# Patient Record
Sex: Male | Born: 1990 | Race: White | Hispanic: No | Marital: Single | Smoking: Never smoker
Health system: Southern US, Community
[De-identification: ages and names within clinical notes are randomized; demographics above are authoritative.]

## PROBLEM LIST (undated history)

## (undated) DIAGNOSIS — Z9889 Other specified postprocedural states: Secondary | ICD-10-CM

## (undated) DIAGNOSIS — R112 Nausea with vomiting, unspecified: Secondary | ICD-10-CM

---

## 2015-08-10 ENCOUNTER — Emergency Department (HOSPITAL_BASED_OUTPATIENT_CLINIC_OR_DEPARTMENT_OTHER)
Admission: EM | Admit: 2015-08-10 | Discharge: 2015-08-11 | Disposition: A | Discharge status: Home / Self Care | Payer: Worker's Compensation | Attending: Emergency Medicine | Admitting: Emergency Medicine

## 2015-08-10 ENCOUNTER — Emergency Department (HOSPITAL_BASED_OUTPATIENT_CLINIC_OR_DEPARTMENT_OTHER): Payer: Worker's Compensation

## 2015-08-10 ENCOUNTER — Encounter (HOSPITAL_BASED_OUTPATIENT_CLINIC_OR_DEPARTMENT_OTHER): Payer: Self-pay | Admitting: Emergency Medicine

## 2015-08-10 DIAGNOSIS — Y990 Civilian activity done for income or pay: Secondary | ICD-10-CM | POA: Diagnosis not present

## 2015-08-10 DIAGNOSIS — Y9289 Other specified places as the place of occurrence of the external cause: Secondary | ICD-10-CM | POA: Insufficient documentation

## 2015-08-10 DIAGNOSIS — S60222A Contusion of left hand, initial encounter: Secondary | ICD-10-CM | POA: Insufficient documentation

## 2015-08-10 DIAGNOSIS — S6992XA Unspecified injury of left wrist, hand and finger(s), initial encounter: Secondary | ICD-10-CM | POA: Diagnosis present

## 2015-08-10 DIAGNOSIS — W228XXA Striking against or struck by other objects, initial encounter: Secondary | ICD-10-CM | POA: Insufficient documentation

## 2015-08-10 DIAGNOSIS — Y9389 Activity, other specified: Secondary | ICD-10-CM | POA: Diagnosis not present

## 2015-08-10 MED ORDER — HYDROCODONE-ACETAMINOPHEN 5-325 MG PO TABS: 1.0000 | ORAL_TABLET | Freq: Four times a day (QID) | ORAL | Status: DC | PRN

## 2015-08-10 MED ORDER — HYDROCODONE-ACETAMINOPHEN 5-325 MG PO TABS
1.0000 | ORAL_TABLET | Freq: Once | ORAL | Status: AC
  Administered 2015-08-10: 1 via ORAL
  Filled 2015-08-10: qty 1

## 2015-08-10 NOTE — ED Provider Notes (Signed)
CSN: [id number]   Arrival date & time 08/10/15 2250  History  By signing my name below, I, Bethel BornBritney McCollum, attest that this documentation has been prepared under the direction and in the presence of Paula LibraJohn Chandria Rookstool, MD. Electronically Signed: Bethel BornBritney McCollum, ED Scribe. 08/10/2015. 11:40 PM.  Chief Complaint  Patient presents with  . Hand Injury    HPI The history is provided by the patient. No language interpreter was used.   Ernest GarlandMichael Tomassetti is a 24 y.o. male who presents to the Emergency Department complaining of a left hand injury with onset this evening at work. He was working with a Database administratorrivet gun; when the trigger was compressed the gun fell impacting the tool on the hand.  Associated symptoms include pain at the left hand that is worst between the thumb and ring finger. He describes the pain as "really sore", worse with palpation or movement. There is associated swelling and ecchymosis but no laceration or abrasion. No other injury.   History reviewed. No pertinent past medical history.  History reviewed. No pertinent past surgical history.  History reviewed. No pertinent family history.  Social History  Substance Use Topics  . Smoking status: Never Smoker   . Smokeless tobacco: None  . Alcohol Use: Yes     Comment: occ     Review of Systems 10 Systems reviewed and all are negative for acute change except as noted in the HPI. Home Medications   Prior to Admission medications   Not on File    Allergies  Review of patient's allergies indicates no known allergies.  Triage Vitals: BP 128/87 mmHg  Pulse 68  Temp(Src) 98.7 F (37.1 C) (Oral)  Resp 18  Ht 5\' 10"  (1.778 m)  Wt 130 lb (58.968 kg)  BMI 18.65 kg/m2  SpO2 100%  Physical Exam  Nursing note and vitals reviewed. General: Well-developed, well-nourished male in no acute distress; appearance consistent with age of record HENT: normocephalic; atraumatic Eyes: pupils equal, round and reactive to light; extraocular  muscles intact Neck: supple Heart: regular rate and rhythm Lungs: clear to auscultation bilaterally Abdomen: soft; nondistended Extremities: Swelling and ecchymosis of left thenar web with no bony point tenderness except at the second MCP joint; fingers of left hand NVI with intact tendon function; no deformity; decreased range of motion of left thumb and second finger; no wrist or anatomical snuff box tenderness; pulses normal Neurologic: Awake, alert and oriented; motor function intact in all extremities and symmetric; no facial droop Skin: Warm and dry Psychiatric: Normal mood and affect  ED Course  Procedures   DIAGNOSTIC STUDIES: Oxygen Saturation is 100% on RA, normal by my interpretation.    COORDINATION OF CARE: 11:29 PM Discussed treatment plan which includes left hand XR and pain management with pt at bedside and pt agreed to plan.   MDM  Nursing notes and vitals signs, including pulse oximetry, reviewed.  Summary of this visit's results, reviewed by myself:  Imaging Studies: Dg Hand Complete Left  08/10/2015  CLINICAL DATA:  24 year old male equipment fell on left hand at work 2 hr ago with pain from the 1st to 2nd ray. Initial encounter. EXAM: LEFT HAND - COMPLETE 3+ VIEW COMPARISON:  None. FINDINGS: Bone mineralization is within normal limits. Distal radius and ulna intact. Carpal bone alignment within normal limits. No metacarpal fracture identified. No phalanx fracture identified. Left thumb and index finger osseous structures appear within normal limits. No radiopaque foreign body identified. No subcutaneous gas. IMPRESSION: No acute fracture or dislocation  identified about the left hand. Electronically Signed   By: Odessa Fleming M.D.   On: 08/10/2015 23:31   I personally performed the services described in this documentation, which was scribed in my presence. The recorded information has been reviewed and is accurate.    Paula Libra, MD 08/10/15 [phone]

## 2015-08-10 NOTE — ED Notes (Addendum)
Patient was hit in the hand with a pneumatic tool. Patient states that the tool was at full pressure when it hit his hand, and he has swelling and possible deformity noted.

## 2015-08-10 NOTE — Discharge Instructions (Signed)
Hand Contusion  A hand contusion is a deep bruise on your hand area. Contusions are the result of an injury that caused bleeding under the skin. The contusion may turn blue, purple, or yellow. Minor injuries will give you a painless contusion, but more severe contusions may stay painful and swollen for a few weeks.  CAUSES   A contusion is usually caused by a blow, trauma, or direct force to an area of the body.  SYMPTOMS    Swelling and redness of the injured area.   Discoloration of the injured area.   Tenderness and soreness of the injured area.   Pain.  DIAGNOSIS   The diagnosis can be made by taking a history and performing a physical exam. An X-ray, CT scan, or MRI may be needed to determine if there were any associated injuries, such as broken bones (fractures).  TREATMENT   Often, the best treatment for a hand contusion is resting, elevating, icing, and applying cold compresses to the injured area. Over-the-counter medicines may also be recommended for pain control.  HOME CARE INSTRUCTIONS    Put ice on the injured area.    Put ice in a plastic bag.    Place a towel between your skin and the bag.    Leave the ice on for 15-20 minutes, 03-04 times a day.   Only take over-the-counter or prescription medicines as directed by your caregiver. Your caregiver may recommend avoiding anti-inflammatory medicines (aspirin, ibuprofen, and naproxen) for 48 hours because these medicines may increase bruising.   If told, use an elastic wrap as directed. This can help reduce swelling. You may remove the wrap for sleeping, showering, and bathing. If your fingers become numb, cold, or blue, take the wrap off and reapply it more loosely.   Elevate your hand with pillows to reduce swelling.   Avoid overusing your hand if it is painful.  SEEK IMMEDIATE MEDICAL CARE IF:    You have increased redness, swelling, or pain in your hand.   Your swelling or pain is not relieved with medicines.   You have loss of feeling in  your hand or are unable to move your fingers.   Your hand turns cold or blue.   You have pain when you move your fingers.   Your hand becomes warm to the touch.   Your contusion does not improve in 2 days.  MAKE SURE YOU:    Understand these instructions.   Will watch your condition.   Will get help right away if you are not doing well or get worse.     This information is not intended to replace advice given to you by your health care provider. Make sure you discuss any questions you have with your health care provider.     Document Released: 04/06/2002 Document Revised: 07/09/2012 Document Reviewed: 04/07/2012  Elsevier Interactive Patient Education 2016 [employer]

## 2016-02-02 ENCOUNTER — Emergency Department (HOSPITAL_COMMUNITY): Payer: Worker's Compensation | Admitting: Certified Registered Nurse Anesthetist

## 2016-02-02 ENCOUNTER — Observation Stay (HOSPITAL_COMMUNITY)
Admission: EM | Admit: 2016-02-02 | Discharge: 2016-02-03 | Disposition: A | Discharge status: Home / Self Care | Payer: Worker's Compensation | Attending: Orthopedic Surgery | Admitting: Orthopedic Surgery

## 2016-02-02 ENCOUNTER — Emergency Department (HOSPITAL_COMMUNITY): Payer: Worker's Compensation

## 2016-02-02 ENCOUNTER — Encounter (HOSPITAL_COMMUNITY)
Admission: EM | Disposition: A | Discharge status: Home / Self Care | Payer: Self-pay | Source: Home / Self Care | Attending: Emergency Medicine

## 2016-02-02 ENCOUNTER — Encounter (HOSPITAL_COMMUNITY): Payer: Self-pay

## 2016-02-02 DIAGNOSIS — Y9269 Other specified industrial and construction area as the place of occurrence of the external cause: Secondary | ICD-10-CM | POA: Diagnosis not present

## 2016-02-02 DIAGNOSIS — W38XXXA Explosion and rupture of other specified pressurized devices, initial encounter: Secondary | ICD-10-CM | POA: Insufficient documentation

## 2016-02-02 DIAGNOSIS — S6981XA Other specified injuries of right wrist, hand and finger(s), initial encounter: Secondary | ICD-10-CM | POA: Diagnosis present

## 2016-02-02 DIAGNOSIS — W298XXA Contact with other powered powered hand tools and household machinery, initial encounter: Secondary | ICD-10-CM

## 2016-02-02 DIAGNOSIS — Z23 Encounter for immunization: Secondary | ICD-10-CM | POA: Diagnosis not present

## 2016-02-02 DIAGNOSIS — S61421A Laceration with foreign body of right hand, initial encounter: Principal | ICD-10-CM | POA: Insufficient documentation

## 2016-02-02 DIAGNOSIS — Y990 Civilian activity done for income or pay: Secondary | ICD-10-CM | POA: Insufficient documentation

## 2016-02-02 HISTORY — PX: I & D EXTREMITY: SHX5045

## 2016-02-02 SURGERY — IRRIGATION AND DEBRIDEMENT EXTREMITY
Anesthesia: General | Laterality: Right

## 2016-02-02 MED ORDER — HYDROMORPHONE HCL 1 MG/ML IJ SOLN
1.0000 mg | Freq: Once | INTRAMUSCULAR | Status: AC
  Administered 2016-02-02: 1 mg via INTRAVENOUS
  Filled 2016-02-02: qty 1

## 2016-02-02 MED ORDER — LACTATED RINGERS IV SOLN
INTRAVENOUS | Status: DC
  Administered 2016-02-02 (×2): via INTRAVENOUS

## 2016-02-02 MED ORDER — FENTANYL CITRATE (PF) 250 MCG/5ML IJ SOLN
INTRAMUSCULAR | Status: AC
  Filled 2016-02-02: qty 5

## 2016-02-02 MED ORDER — CEFAZOLIN SODIUM 1-5 GM-% IV SOLN
INTRAVENOUS | Status: DC | PRN
  Administered 2016-02-02: 1 g via INTRAVENOUS

## 2016-02-02 MED ORDER — OXYCODONE HCL 5 MG PO TABS: 5.0000 mg | ORAL_TABLET | Freq: Once | ORAL | Status: DC | PRN

## 2016-02-02 MED ORDER — OXYCODONE HCL 5 MG/5ML PO SOLN: 5.0000 mg | Freq: Once | ORAL | Status: DC | PRN

## 2016-02-02 MED ORDER — ACETAMINOPHEN 325 MG PO TABS: 325.0000 mg | ORAL_TABLET | ORAL | Status: DC | PRN

## 2016-02-02 MED ORDER — ONDANSETRON HCL 4 MG/2ML IJ SOLN
4.0000 mg | Freq: Four times a day (QID) | INTRAMUSCULAR | Status: DC | PRN
  Administered 2016-02-02: 4 mg via INTRAVENOUS
  Filled 2016-02-02: qty 2

## 2016-02-02 MED ORDER — ONDANSETRON HCL 4 MG PO TABS: 4.0000 mg | ORAL_TABLET | Freq: Four times a day (QID) | ORAL | Status: DC | PRN

## 2016-02-02 MED ORDER — IBUPROFEN 800 MG PO TABS
800.0000 mg | ORAL_TABLET | Freq: Three times a day (TID) | ORAL | Status: DC
  Administered 2016-02-02 – 2016-02-03 (×3): 800 mg via ORAL
  Filled 2016-02-02: qty 4
  Filled 2016-02-02 (×4): qty 1
  Filled 2016-02-02 (×2): qty 4
  Filled 2016-02-02: qty 1

## 2016-02-02 MED ORDER — OXYCODONE-ACETAMINOPHEN 5-325 MG PO TABS
1.0000 | ORAL_TABLET | ORAL | Status: DC | PRN
  Administered 2016-02-02 – 2016-02-03 (×3): 2 via ORAL
  Filled 2016-02-02 (×2): qty 2

## 2016-02-02 MED ORDER — PROPOFOL 10 MG/ML IV BOLUS
INTRAVENOUS | Status: DC | PRN
  Administered 2016-02-02: 200 mg via INTRAVENOUS

## 2016-02-02 MED ORDER — CEFAZOLIN SODIUM 1-5 GM-% IV SOLN
1.0000 g | Freq: Three times a day (TID) | INTRAVENOUS | Status: AC
  Administered 2016-02-02 – 2016-02-03 (×2): 1 g via INTRAVENOUS
  Filled 2016-02-02 (×3): qty 50

## 2016-02-02 MED ORDER — OXYCODONE-ACETAMINOPHEN 5-325 MG PO TABS
ORAL_TABLET | ORAL | Status: AC
  Filled 2016-02-02: qty 2

## 2016-02-02 MED ORDER — ACETAMINOPHEN 160 MG/5ML PO SOLN
325.0000 mg | ORAL | Status: DC | PRN
  Filled 2016-02-02: qty 20.3

## 2016-02-02 MED ORDER — OXYCODONE-ACETAMINOPHEN 5-325 MG PO TABS: 1.0000 | ORAL_TABLET | ORAL | Status: DC | PRN

## 2016-02-02 MED ORDER — HYDROMORPHONE HCL 1 MG/ML IJ SOLN
INTRAMUSCULAR | Status: AC
  Filled 2016-02-02: qty 2

## 2016-02-02 MED ORDER — LIDOCAINE HCL (CARDIAC) 20 MG/ML IV SOLN
INTRAVENOUS | Status: DC | PRN
  Administered 2016-02-02: 60 mg via INTRATRACHEAL

## 2016-02-02 MED ORDER — CEFAZOLIN SODIUM 1-5 GM-% IV SOLN
1.0000 g | Freq: Once | INTRAVENOUS | Status: AC
  Administered 2016-02-02: 1 g via INTRAVENOUS
  Filled 2016-02-02: qty 50

## 2016-02-02 MED ORDER — SODIUM CHLORIDE 0.9 % IR SOLN
Status: DC | PRN
  Administered 2016-02-02: 1000 mL
  Administered 2016-02-02: 3000 mL

## 2016-02-02 MED ORDER — HYDROMORPHONE HCL 2 MG PO TABS: 2.0000 mg | ORAL_TABLET | ORAL | Status: DC | PRN

## 2016-02-02 MED ORDER — ONDANSETRON HCL 4 MG/2ML IJ SOLN
INTRAMUSCULAR | Status: DC | PRN
  Administered 2016-02-02: 4 mg via INTRAVENOUS

## 2016-02-02 MED ORDER — HYDROMORPHONE HCL 1 MG/ML IJ SOLN
0.2500 mg | INTRAMUSCULAR | Status: DC | PRN
  Administered 2016-02-02 (×4): 0.5 mg via INTRAVENOUS

## 2016-02-02 MED ORDER — TETANUS-DIPHTH-ACELL PERTUSSIS 5-2.5-18.5 LF-MCG/0.5 IM SUSP
0.5000 mL | Freq: Once | INTRAMUSCULAR | Status: AC
  Administered 2016-02-02: 0.5 mL via INTRAMUSCULAR
  Filled 2016-02-02: qty 0.5

## 2016-02-02 MED ORDER — MIDAZOLAM HCL 2 MG/2ML IJ SOLN
INTRAMUSCULAR | Status: AC
  Filled 2016-02-02: qty 2

## 2016-02-02 MED ORDER — BUPIVACAINE-EPINEPHRINE (PF) 0.5% -1:200000 IJ SOLN
INTRAMUSCULAR | Status: DC | PRN
  Administered 2016-02-02: 40 mL

## 2016-02-02 MED ORDER — IBUPROFEN 800 MG PO TABS: 800.0000 mg | ORAL_TABLET | Freq: Three times a day (TID) | ORAL | Status: AC

## 2016-02-02 MED ORDER — PROPOFOL 10 MG/ML IV BOLUS
INTRAVENOUS | Status: AC
  Filled 2016-02-02: qty 20

## 2016-02-02 MED ORDER — HYDROMORPHONE HCL 1 MG/ML IJ SOLN
0.5000 mg | INTRAMUSCULAR | Status: DC | PRN
  Administered 2016-02-03 (×2): 1 mg via INTRAVENOUS
  Filled 2016-02-02 (×3): qty 1

## 2016-02-02 MED ORDER — AMOXICILLIN-POT CLAVULANATE 875-125 MG PO TABS: 1.0000 | ORAL_TABLET | Freq: Two times a day (BID) | ORAL | Status: DC

## 2016-02-02 MED ORDER — HYDROCODONE-ACETAMINOPHEN 5-325 MG PO TABS
1.0000 | ORAL_TABLET | ORAL | Status: DC | PRN
  Administered 2016-02-03 (×2): 2 via ORAL
  Filled 2016-02-02 (×2): qty 2

## 2016-02-02 MED ORDER — FENTANYL CITRATE (PF) 100 MCG/2ML IJ SOLN
INTRAMUSCULAR | Status: DC | PRN
  Administered 2016-02-02: 50 ug via INTRAVENOUS
  Administered 2016-02-02: 25 ug via INTRAVENOUS
  Administered 2016-02-02: 100 ug via INTRAVENOUS
  Administered 2016-02-02: 75 ug via INTRAVENOUS

## 2016-02-02 SURGICAL SUPPLY — 47 items
BNDG COHESIVE 4X5 TAN STRL (GAUZE/BANDAGES/DRESSINGS) ×3 IMPLANT
BNDG ESMARK 4X9 LF (GAUZE/BANDAGES/DRESSINGS) IMPLANT
BNDG GAUZE ELAST 4 BULKY (GAUZE/BANDAGES/DRESSINGS) ×3 IMPLANT
CHLORAPREP W/TINT 26ML (MISCELLANEOUS) ×3 IMPLANT
CORDS BIPOLAR (ELECTRODE) ×3 IMPLANT
COVER SURGICAL LIGHT HANDLE (MISCELLANEOUS) ×3 IMPLANT
CUFF TOURNIQUET SINGLE 18IN (TOURNIQUET CUFF) ×3 IMPLANT
CUFF TOURNIQUET SINGLE 24IN (TOURNIQUET CUFF) IMPLANT
DRAIN PENROSE 1/4X12 LTX STRL (WOUND CARE) ×3 IMPLANT
DRAPE SURG 17X23 STRL (DRAPES) ×3 IMPLANT
DRSG ADAPTIC 3X8 NADH LF (GAUZE/BANDAGES/DRESSINGS) ×3 IMPLANT
ELECT REM PT RETURN 9FT ADLT (ELECTROSURGICAL) ×3
ELECTRODE REM PT RTRN 9FT ADLT (ELECTROSURGICAL) ×1 IMPLANT
EVACUATOR 1/8 PVC DRAIN (DRAIN) IMPLANT
GAUZE SPONGE 4X4 12PLY STRL (GAUZE/BANDAGES/DRESSINGS) ×3 IMPLANT
GLOVE BIO SURGEON STRL SZ 6.5 (GLOVE) ×2 IMPLANT
GLOVE BIO SURGEON STRL SZ7.5 (GLOVE) ×6 IMPLANT
GLOVE BIO SURGEONS STRL SZ 6.5 (GLOVE) ×1
GLOVE BIOGEL PI IND STRL 7.0 (GLOVE) ×2 IMPLANT
GLOVE BIOGEL PI IND STRL 8 (GLOVE) ×1 IMPLANT
GLOVE BIOGEL PI INDICATOR 7.0 (GLOVE) ×4
GLOVE BIOGEL PI INDICATOR 8 (GLOVE) ×2
GOWN STRL REUS W/ TWL LRG LVL3 (GOWN DISPOSABLE) ×3 IMPLANT
GOWN STRL REUS W/TWL LRG LVL3 (GOWN DISPOSABLE) ×6
KIT BASIN OR (CUSTOM PROCEDURE TRAY) ×3 IMPLANT
KIT ROOM TURNOVER OR (KITS) ×3 IMPLANT
MANIFOLD NEPTUNE II (INSTRUMENTS) ×3 IMPLANT
NS IRRIG 1000ML POUR BTL (IV SOLUTION) ×3 IMPLANT
PACK ORTHO EXTREMITY (CUSTOM PROCEDURE TRAY) ×3 IMPLANT
PAD ARMBOARD 7.5X6 YLW CONV (MISCELLANEOUS) ×6 IMPLANT
PAD CAST 4YDX4 CTTN HI CHSV (CAST SUPPLIES) ×1 IMPLANT
PADDING CAST COTTON 4X4 STRL (CAST SUPPLIES) ×2
PILLOW ARM CARTER ADULT (MISCELLANEOUS) ×3 IMPLANT
SET IRRIG Y TYPE TUR BLADDER L (SET/KITS/TRAYS/PACK) ×3 IMPLANT
SPLINT FIBERGLASS 3X35 (CAST SUPPLIES) ×3 IMPLANT
SPONGE GAUZE 4X4 12PLY STER LF (GAUZE/BANDAGES/DRESSINGS) ×3 IMPLANT
SPONGE LAP 18X18 X RAY DECT (DISPOSABLE) ×3 IMPLANT
SUT ETHILON 4 0 PS 2 18 (SUTURE) IMPLANT
SUT PROLENE 1 CT (SUTURE) IMPLANT
SUT VICRYL RAPIDE 4/0 PS 2 (SUTURE) ×6 IMPLANT
SYRINGE 10CC LL (SYRINGE) IMPLANT
TOWEL OR 17X24 6PK STRL BLUE (TOWEL DISPOSABLE) ×3 IMPLANT
TOWEL OR 17X26 10 PK STRL BLUE (TOWEL DISPOSABLE) ×3 IMPLANT
TUBE CONNECTING 12'X1/4 (SUCTIONS) ×1
TUBE CONNECTING 12X1/4 (SUCTIONS) ×2 IMPLANT
TUBING CYSTO DISP (UROLOGICAL SUPPLIES) ×3 IMPLANT
UNDERPAD 30X30 INCONTINENT (UNDERPADS AND DIAPERS) ×3 IMPLANT

## 2016-02-02 NOTE — ED Provider Notes (Signed)
CSN: [id number]     Arrival date & time 02/02/16  1009 History   First MD Initiated Contact with Patient 02/02/16 1012     Chief Complaint  Patient presents with  . Hand Injury     (Consider location/radiation/quality/duration/timing/severity/associated sxs/prior Treatment) Patient is a 25 y.o. male presenting with hand injury. The history is provided by the patient.  Hand Injury Location:  Hand Time since incident:  45 minutes Injury: yes   Mechanism of injury comment:  High pressure injection injury of hydraulic fluid Hand location:  R hand (in thenar eminence) Pain details:    Quality:  Pressure   Radiates to:  R elbow   Severity:  Severe   Onset quality:  Sudden   Duration: 45 minutes.   Timing:  Constant   Progression:  Worsening Chronicity:  New Tetanus status:  Unknown Relieved by:  Nothing Worsened by:  Movement (movement of hand worsens pain) Associated symptoms: decreased range of motion and swelling   Associated symptoms: no back pain and no fever     History reviewed. No pertinent past medical history. History reviewed. No pertinent past surgical history. History reviewed. No pertinent family history. Social History  Substance Use Topics  . Smoking status: Never Smoker   . Smokeless tobacco: None  . Alcohol Use: Yes     Comment: occ    Review of Systems  Constitutional: Negative for fever, diaphoresis, activity change and appetite change.  HENT: Negative for facial swelling, sore throat, tinnitus, trouble swallowing and voice change.   Eyes: Negative for pain, redness and visual disturbance.  Respiratory: Negative for chest tightness, shortness of breath and wheezing.   Cardiovascular: Negative for chest pain, palpitations and leg swelling.  Gastrointestinal: Negative for nausea, vomiting, abdominal pain, diarrhea, constipation and abdominal distention.  Endocrine: Negative.   Genitourinary: Negative.  Negative for dysuria, decreased urine volume,  scrotal swelling and testicular pain.  Musculoskeletal: Positive for arthralgias. Negative for myalgias, back pain and gait problem.  Skin: Positive for wound. Negative for rash.  Neurological: Negative.  Negative for dizziness, tremors, weakness and headaches.  Psychiatric/Behavioral: Negative for suicidal ideas, hallucinations and self-injury. The patient is not nervous/anxious.       Allergies  Review of patient's allergies indicates no known allergies.  Home Medications   Prior to Admission medications   Not on File   BP 109/77 mmHg  Pulse 94  Temp(Src) 98.4 F (36.9 C) (Oral)  Resp 18  SpO2 99% Physical Exam  Constitutional: He is oriented to person, place, and time. He appears well-developed and well-nourished. He appears distressed (in mild distress due to pain).  HENT:  Head: Normocephalic and atraumatic.  Right Ear: External ear normal.  Left Ear: External ear normal.  Nose: Nose normal.  Eyes: Conjunctivae and EOM are normal. Pupils are equal, round, and reactive to light. No scleral icterus.  Neck: Normal range of motion. Neck supple. No JVD present. No tracheal deviation present. No thyromegaly present.  Cardiovascular: Normal rate and intact distal pulses.   Pulmonary/Chest: Effort normal and breath sounds normal. No stridor. No respiratory distress. He has no wheezes. He has no rales.  Abdominal: Soft. He exhibits no distension. There is no tenderness. There is no rebound and no guarding.  Musculoskeletal: He exhibits edema and tenderness (small puncture wound in thenar eminence with surrounding dried blood but no active bleeding. Sourounding swelling. Significant pain wiht passive movement of thumb).  Neurological: He is alert and oriented to person, place, and time. No  cranial nerve deficit. He exhibits normal muscle tone. Coordination normal.  Sensation intact in radial, median, and ulnar distributions of right hand   Skin: Skin is warm and dry. No rash noted.  He is not diaphoretic.  Psychiatric: He has a normal mood and affect. His behavior is normal.  Nursing note and vitals reviewed.        ED Course  Procedures (including critical care time) Labs Review Labs Reviewed - No data to display  Imaging Review Dg Hand Complete Right  02/02/2016  CLINICAL DATA:  Pain throughout RIGHT hand for 2 hours after injury work with hydraulic powered gun, uncertain as to specific injury, gun exploded in hand, small lacerations between first and second metacarpals, swelling, pain, initial encounter EXAM: RIGHT HAND - COMPLETE 3+ VIEW COMPARISON:  None FINDINGS: Diffuse soft tissue swelling greatest dorsally. Osseous mineralization normal. Small radiopaque foreign bodies are identified at the thenar eminence, largest 4 mm diameter. Few foci of nonspecific soft tissue gas identified. No acute fracture, dislocation, or bone destruction. IMPRESSION: Soft tissue swelling with few scattered foci of soft tissue gas compatible with penetrating injury. Small radiopaque foreign bodies at thenar eminence largest 4 mm diameter. No definite acute osseous abnormalities. Electronically Signed   By: Ulyses Southward M.D.   On: 02/02/2016 11:08   I have personally reviewed and evaluated these images and lab results as part of my medical decision-making.   EKG Interpretation None      MDM   Final diagnoses:  High-pressure injection injury of finger of right hand, initial encounter    The patient is a 25 year old male who denies any past medical history who presents 45 minutes after suffering a high pressure injection injury to his right hand. The patient is afebrile hemolytically stable. Physical exam as above. Plain films show gas consistent with pressure injection injury as well as small foreign bodies. Orthopedic surgery is consult to and the case is reviewed with them. They will take the patient to the OR for surgical management. Patient's versus understanding and agreement  with this plan. Tetanus updated, Ancef given, pain well-controlled in the ED.  Patient seen with attending, Dr. Rosalia Hammers, who oversaw clinical decision making.     Lula Olszewski, MD 02/02/16 1204  Margarita Grizzle, MD 02/02/16 [phone]

## 2016-02-02 NOTE — ED Notes (Signed)
[facility]- pt coming from work after his hand was injured. He was using a hydraulic gun when it back fired and hit his hand. Pt is unsure if there could be a foreign body in his hand. Pulses strong and good cap refill noted.

## 2016-02-02 NOTE — ED Notes (Signed)
Pt transported to xray 

## 2016-02-02 NOTE — Consult Note (Signed)
ORTHOPAEDIC CONSULTATION HISTORY & PHYSICAL REQUESTING PHYSICIAN: No att. providers found  Chief Complaint: Right hand injury from high pressure injection  HPI: Ernest Adams is a 25 y.o. male who injured his right hand earlier today with high-pressure injection injury from hydraulic fluid.  He was evaluated in emergency department, x-rays obtained, and I was consulted regarding his predicament.  Apparently, the injection apparatus broke in some way, and he has questions whether there is any portions of the broken gun impaled within him.  History reviewed. No pertinent past medical history. History reviewed. No pertinent past surgical history. Social History   Social History  . Marital Status: Single    Spouse Name: N/A  . Number of Children: N/A  . Years of Education: N/A   Social History Main Topics  . Smoking status: Never Smoker   . Smokeless tobacco: None  . Alcohol Use: Yes     Comment: occ  . Drug Use: No  . Sexual Activity: Not Asked   Other Topics Concern  . None   Social History Narrative   History reviewed. No pertinent family history. No Known Allergies Prior to Admission medications   Not on File   Dg Hand Complete Right  02/02/2016  CLINICAL DATA:  Pain throughout RIGHT hand for 2 hours after injury work with hydraulic powered gun, uncertain as to specific injury, gun exploded in hand, small lacerations between first and second metacarpals, swelling, pain, initial encounter EXAM: RIGHT HAND - COMPLETE 3+ VIEW COMPARISON:  None FINDINGS: Diffuse soft tissue swelling greatest dorsally. Osseous mineralization normal. Small radiopaque foreign bodies are identified at the thenar eminence, largest 4 mm diameter. Few foci of nonspecific soft tissue gas identified. No acute fracture, dislocation, or bone destruction. IMPRESSION: Soft tissue swelling with few scattered foci of soft tissue gas compatible with penetrating injury. Small radiopaque foreign bodies at thenar  eminence largest 4 mm diameter. No definite acute osseous abnormalities. Electronically Signed   By: Ulyses Southward M.D.   On: 02/02/2016 11:08    Positive ROS: All other systems have been reviewed and were otherwise negative with the exception of those mentioned in the HPI and as above.  Physical Exam: Vitals: Refer to EMR. Constitutional:  WD, WN, NAD HEENT:  NCAT, EOMI Neuro/Psych:  Alert & oriented to person, place, and time; appropriate mood & affect Lymphatic: No generalized extremity edema or lymphadenopathy Extremities / MSK:  The extremities are normal with respect to appearance, ranges of motion, joint stability, muscle strength/tone, sensation, & perfusion except as otherwise noted:  Right hand with small wound at the base of the thumb, near the MP flexion crease.  The thenar eminence is swollen, but there is much more significant swollen across the dorsum of the hand into the dorsal distal forearm.  He has pain with any passive movement of the thumb, but can demonstrate active IP flexion and extension.  He reports diminished sensibility but still intact light touch sensibility at the tip of the thumb both radially and ulnarly as well as the index finger.  Assessment: Right hand base of thumb high pressure injection injury with hydraulic fluid, possibly with some retained metallic foreign bodies.  Plan: I discussed these findings with him and recommended urgent operative exploration, with irrigation to diminish the risk for infection and also for postinjection inflammatory changes related to the hydraulic fluid.  Informed bodies will be searched for and removed.  He will be kept overnight for some additional IV antibiotics, discharged likely tomorrow with outpatient follow-up.  Questions invited and answered, consent obtained and document executed.  Cliffton Astersavid A. Janee Mornhompson, MD      Orthopaedic & Hand Surgery [facility] [address] [place], [place]   [id number] Office: [phone] Mobile: [phone]  02/02/2016, 12:09 PM

## 2016-02-02 NOTE — Progress Notes (Signed)
Dr Maple HudsonMoser at bedside to perform block. Timeout performed.

## 2016-02-02 NOTE — Discharge Instructions (Addendum)
Discharge Instructions   You have a light dressing on your hand.   Change it daily.  Wash your hand under running water with soap daily.  You may begin gentle motion of your fingers and hand immediately, but you should not do any heavy lifting or gripping.  Elevate your hand to reduce pain & swelling of the digits.  Ice over the operative site may be helpful to reduce pain & swelling.  DO NOT USE HEAT. Pain medicine has been prescribed for you.  Use your medicine as needed over the first 48 hours, and then you can begin to taper your use. You may use Tylenol in place of your prescribed pain medication, but not IN ADDITION to it. After the bandage has been removed you may shower, regularly washing the incision and letting the water run over it, but not submerging it (no swimming, soaking it in dishwater, etc.) You may drive a car when you are off of prescription pain medications and can safely control your vehicle with both hands. We will address whether therapy will be required or not when you return to the office.   Please call [phone] during normal business hours or [phone] after hours for any problems. Including the following:  - excessive redness of the incisions - drainage for more than 4 days - fever of more than 101.5 F  *Please note that pain medications will not be refilled after hours or on weekends.  WORK STATUS: No work until at least your first postop appointment next week.

## 2016-02-02 NOTE — Transfer of Care (Signed)
Immediate Anesthesia Transfer of Care Note  Patient: Ernest GarlandMichael Adams  Procedure(s) Performed: Procedure(s): IRRIGATION AND DEBRIDEMENT EXTREMITY (Right)  Patient Location: PACU  Anesthesia Type:General  Level of Consciousness: awake, alert , oriented and patient cooperative  Airway & Oxygen Therapy: Patient Spontanous Breathing and Patient connected to nasal cannula oxygen  Post-op Assessment: Report given to RN, Post -op Vital signs reviewed and stable and Patient moving all extremities X 4  Post vital signs: Reviewed and stable  Last Vitals:  Filed Vitals:   02/02/16 1145 02/02/16 1348  BP: 109/77 110/77  Pulse: 94 106  Temp:  36.5 C  Resp: 18 20    Complications: No apparent anesthesia complications

## 2016-02-02 NOTE — Anesthesia Procedure Notes (Addendum)
Procedure Name: LMA Insertion Date/Time: 02/02/2016 12:38 PM Performed by: Rogelia BogaMUELLER, THOMAS P Pre-anesthesia Checklist: Patient identified, Emergency Drugs available, Suction available, Patient being monitored and Timeout performed Patient Re-evaluated:Patient Re-evaluated prior to inductionOxygen Delivery Method: Circle system utilized Preoxygenation: Pre-oxygenation with 100% oxygen Intubation Type: IV induction LMA: LMA inserted LMA Size: 4.0 Number of attempts: 1 Placement Confirmation: positive ETCO2 and breath sounds checked- equal and bilateral Tube secured with: Tape Dental Injury: Teeth and Oropharynx as per pre-operative assessment     Anesthesia Regional Block:  Axillary brachial plexus block  Pre-Anesthetic Checklist: ,, timeout performed, Correct Patient, Correct Site, Correct Laterality, Correct Procedure, Correct Position, site marked, Risks and benefits discussed,  Surgical consent,  Pre-op evaluation,  At surgeon's request and post-op pain management  Laterality: Upper and Right  Prep: chloraprep       Needles:  Injection technique: Single-shot  Needle Type: Echogenic Needle          Additional Needles:  Procedures: ultrasound guided (picture in chart) Axillary brachial plexus block Narrative:  Injection made incrementally with aspirations every 5 mL.  Performed by: Personally   Additional Notes: H+P and labs reviewed, risks and benefits discussed with patient, procedure tolerated well without complications

## 2016-02-02 NOTE — Op Note (Signed)
02/02/2016  12:16 PM  PATIENT:  Ernest Adams  25 y.o. male  PRE-OPERATIVE DIAGNOSIS:  Right hand high pressure injection injury, hydraulic fluid based  POST-OPERATIVE DIAGNOSIS:  Same  PROCEDURE:  Right hand wound exploration, decompression fasciotomy, open carpal tunnel release, removal of metallic foreign bodies, and evacuation of hydraulic fluid/irrigation.  SURGEON: Cliffton Astersavid A. Janee Mornhompson, MD  PHYSICIAN ASSISTANT: Danielle RankinKirsten Schrader, OPA-C  ANESTHESIA:  general  SPECIMENS:  None  DRAINS:   None  EBL:  less than 50 mL  PREOPERATIVE INDICATIONS:  Ernest GarlandMichael Zaldivar is a  25 y.o. male with right hand high pressure injection injury with hydraulic fluid, also likely with some retained foreign bodies from breakage of the apparatus.  The risks benefits and alternatives were discussed with the patient preoperatively including but not limited to the risks of infection, bleeding, nerve injury, cardiopulmonary complications, the need for revision surgery, among others, and the patient verbalized understanding and consented to proceed.  OPERATIVE IMPLANTS: None  OPERATIVE PROCEDURE:  After receiving prophylactic antibiotics in the ED, the patient was escorted to the operative theatre and placed in a supine position.  General anesthesia was administered.  A surgical "time-out" was performed during which the planned procedure, proposed operative site, and the correct patient identity were compared to the operative consent and agreement confirmed by the circulating nurse according to current facility policy.  Following application of a tourniquet to the operative extremity, the exposed skin was pre-scrub with a Hibiclens scrub brush before being formally prepped with Chloraprep and draped in the usual sterile fashion.  The limb was exsanguinated with an Esmarch bandage and the tourniquet inflated to approximately 100mmHg higher than systolic BP.  The traumatic entrance wound was elliptically excised.   It was extended volarly in a zigzag fashion to include an open carpal tunnel release which was performed in standard fashion.  Dorsally, extending from the first web space proximally toward the wrist creases another incision was made in a zigzag fashion.  Skin bridge was left intact and the third incision was made in the distal dorsal forearm.  It was at the wrist incision where hydraulic fluid was encountered, and it was evacuated.  The dorsal wound over the first web space was explored and metallic fragments 3 were removed.  The path of the injection could be ascertained to gone from the volar side of the first web space, through the first dorsal compartment muscle, into the subcutaneous space and then progressing in that space proximally.  The fascia of the first dorsal compartment muscle was further incised.  The muscle appeared healthy.  On the volar side, the remainder of the thenar musculature was opened and spreading fashion to ensure full decompression of the thenar muscle space.  Tourniquet was released, the wounds copiously irrigated to include 3 L of running irrigant.  Additional hemostasis was obtained and the wounds were closed.  The open wound was left open, 3 Penrose drains were placed to allow for further drainage.  The carpal tunnel was closed completely, the remainder the wounds were closed loosely with some gaping to allow for drainage.  A bulky splint dressing was applied and he was taken to room stable condition, breathing spontaneously.  DISPOSITION: He'll be discharged to the floor to be held in observation for an additional 2 doses of IV antibiotics before being discharged home with further f/u as an outpatient.

## 2016-02-02 NOTE — Anesthesia Preprocedure Evaluation (Addendum)
Anesthesia Evaluation  Patient identified by MRN, date of birth, ID band Patient awake    Reviewed: Allergy & Precautions, NPO status , Patient's Chart, lab work & pertinent test results  History of Anesthesia Complications Negative for: history of anesthetic complications  Airway Mallampati: II  TM Distance: >3 FB Neck ROM: Full    Dental  (+) Teeth Intact, Dental Advisory Given   Pulmonary neg pulmonary ROS,    breath sounds clear to auscultation       Cardiovascular negative cardio ROS   Rhythm:Regular     Neuro/Psych negative neurological ROS  negative psych ROS   GI/Hepatic negative GI ROS, Neg liver ROS,   Endo/Other  negative endocrine ROS  Renal/GU negative Renal ROS     Musculoskeletal   Abdominal   Peds  Hematology negative hematology ROS (+)   Anesthesia Other Findings   Reproductive/Obstetrics                            Anesthesia Physical Anesthesia Plan  ASA: I  Anesthesia Plan: General   Post-op Pain Management:    Induction: Intravenous  Airway Management Planned: LMA  Additional Equipment: None  Intra-op Plan:   Post-operative Plan: Extubation in OR  Informed Consent: I have reviewed the patients History and Physical, chart, labs and discussed the procedure including the risks, benefits and alternatives for the proposed anesthesia with the patient or authorized representative who has indicated his/her understanding and acceptance.   Dental advisory given  Plan Discussed with: CRNA, Anesthesiologist and Surgeon  Anesthesia Plan Comments:        Anesthesia Quick Evaluation

## 2016-02-02 NOTE — Progress Notes (Signed)
Resting comfortably, block still working. Hand elevated, fingers puffy but with good cap refill/warmth  D/w patient this injury and rehab, etc.  Will plan to pull penrose drains tomorrow before d/c from hospital, likely to be in early afternoon.  Neil Crouchave Demitrios Molyneux, MD [facility] [phone]

## 2016-02-03 ENCOUNTER — Encounter (HOSPITAL_COMMUNITY): Payer: Self-pay | Admitting: Orthopedic Surgery

## 2016-02-03 DIAGNOSIS — S61421A Laceration with foreign body of right hand, initial encounter: Secondary | ICD-10-CM | POA: Diagnosis not present

## 2016-02-03 NOTE — Progress Notes (Signed)
Pt ready for d/c home per MD. Discharge instructions and prescriptions reviewed with pt and his family, all questions answered.   Ernest Adams, [name]

## 2016-02-03 NOTE — Discharge Summary (Signed)
Physician Discharge Summary  Patient ID: Ernest Adams MRN: [id number] DOB/AGE: 27/04/1991 24 y.o.  Admit date: 02/02/2016 Discharge date: 02/03/2016  Admission Diagnoses:  Right hand high-pressure injection injury  Discharge Diagnoses:  Active Problems:   High-pressure injection injury of finger of right hand   History reviewed. No pertinent past medical history.  Surgeries: Procedure(s): IRRIGATION AND DEBRIDEMENT EXTREMITY on 02/02/2016   Consultants (if any):    Discharged Condition: Improved  Hospital Course: Ernest GarlandMichael Lyerly is an 25 y.o. male who was admitted 02/02/2016 with a diagnosis of right hand high pressure injection injury 2and went to the operating room on 02/02/2016 and underwent the above named procedures.    He was given perioperative antibiotics:  Anti-infectives    Start     Dose/Rate Route Frequency Ordered Stop   02/02/16 2000  ceFAZolin (ANCEF) IVPB 1 g/50 mL premix     1 g 100 mL/hr over 30 Minutes Intravenous 3 times per day 02/02/16 1510 02/03/16 0646   02/02/16 1045  ceFAZolin (ANCEF) IVPB 1 g/50 mL premix     1 g 100 mL/hr over 30 Minutes Intravenous  Once 02/02/16 1030 02/02/16 1149   02/02/16 0000  amoxicillin-clavulanate (AUGMENTIN) 875-125 MG tablet     1 tablet Oral 2 times daily 02/02/16 2311      .  He was given sequential compression devices, early ambulation,  for DVT prophylaxis.  He benefited maximally from the hospital stay and there were no complications.  At time of discharge, median sensation had improved and swelling had gotten markedly better.  Pain was tolerable on PO meds.  Recent vital signs:  Filed Vitals:   02/03/16 0435 02/03/16 1302  BP: 120/67 120/74  Pulse: 68 82  Temp: 97.8 F (36.6 C) 98 F (36.7 C)  Resp: 16 16    Recent laboratory studies:  No results found for: HGB No results found for: WBC, PLT No results found for: INR No results found for: NA, K, CL, CO2, BUN, CREATININE, GLUCOSE  Discharge  Medications:     Medication List    TAKE these medications        amoxicillin-clavulanate 875-125 MG tablet  Commonly known as:  AUGMENTIN  Take 1 tablet by mouth 2 (two) times daily.     HYDROmorphone 2 MG tablet  Commonly known as:  DILAUDID  Take 1-2 tablets (2-4 mg total) by mouth every 4 (four) hours as needed for severe pain (not relieved by percocet).     ibuprofen 800 MG tablet  Commonly known as:  ADVIL,MOTRIN  Take 1 tablet (800 mg total) by mouth 3 (three) times daily.     oxyCODONE-acetaminophen 5-325 MG tablet  Commonly known as:  PERCOCET/ROXICET  Take 1-2 tablets by mouth every 4 (four) hours as needed for moderate pain or severe pain.        Diagnostic Studies: Dg Hand Complete Right  02/02/2016  CLINICAL DATA:  Pain throughout RIGHT hand for 2 hours after injury work with hydraulic powered gun, uncertain as to specific injury, gun exploded in hand, small lacerations between first and second metacarpals, swelling, pain, initial encounter EXAM: RIGHT HAND - COMPLETE 3+ VIEW COMPARISON:  None FINDINGS: Diffuse soft tissue swelling greatest dorsally. Osseous mineralization normal. Small radiopaque foreign bodies are identified at the thenar eminence, largest 4 mm diameter. Few foci of nonspecific soft tissue gas identified. No acute fracture, dislocation, or bone destruction. IMPRESSION: Soft tissue swelling with few scattered foci of soft tissue gas compatible with penetrating injury. Small  radiopaque foreign bodies at thenar eminence largest 4 mm diameter. No definite acute osseous abnormalities. Electronically Signed   By: Ulyses Southward M.D.   On: 02/02/2016 11:08    Disposition: 01-Home or Self Care        Follow-up Information    Schedule an appointment as soon as possible for a visit with Jodi Marble., MD.   Specialty:  Orthopedic Surgery   Why:  for Wed or Thursday this next week   Contact information:   [address] [place]  [address] [phone]        Signed: Janee Morn, Rakim Moone A. 02/03/2016, 2:31 PM

## 2016-02-05 NOTE — Anesthesia Postprocedure Evaluation (Signed)
Anesthesia Post Note  Patient: Ernest Adams  Procedure(s) Performed: Procedure(s) (LRB): IRRIGATION AND DEBRIDEMENT EXTREMITY (Right)  Patient location during evaluation: PACU Anesthesia Type: General and Regional Level of consciousness: awake Pain management: pain level controlled Vital Signs Assessment: post-procedure vital signs reviewed and stable Respiratory status: spontaneous breathing and respiratory function stable Cardiovascular status: stable Postop Assessment: no signs of nausea or vomiting Anesthetic complications: no    Last Vitals:  Filed Vitals:   02/03/16 0435 02/03/16 1302  BP: 120/67 120/74  Pulse: 68 82  Temp: 36.6 C 36.7 C  Resp: 16 16    Last Pain:  Filed Vitals:   02/03/16 1545  PainSc: 5                  [name]

## 2017-03-10 IMAGING — CR DG HAND COMPLETE 3+V*R*
4 series · 4 of 4 positions shown · non-contrast
Comparison: None

CLINICAL DATA: Pain throughout RIGHT hand for 2 hours after injury
work with hydraulic powered gun, uncertain as to specific injury,
gun exploded in hand, small lacerations between first and second
metacarpals, swelling, pain, initial encounter

EXAM:
RIGHT HAND - COMPLETE 3+ VIEW

[hand pa]
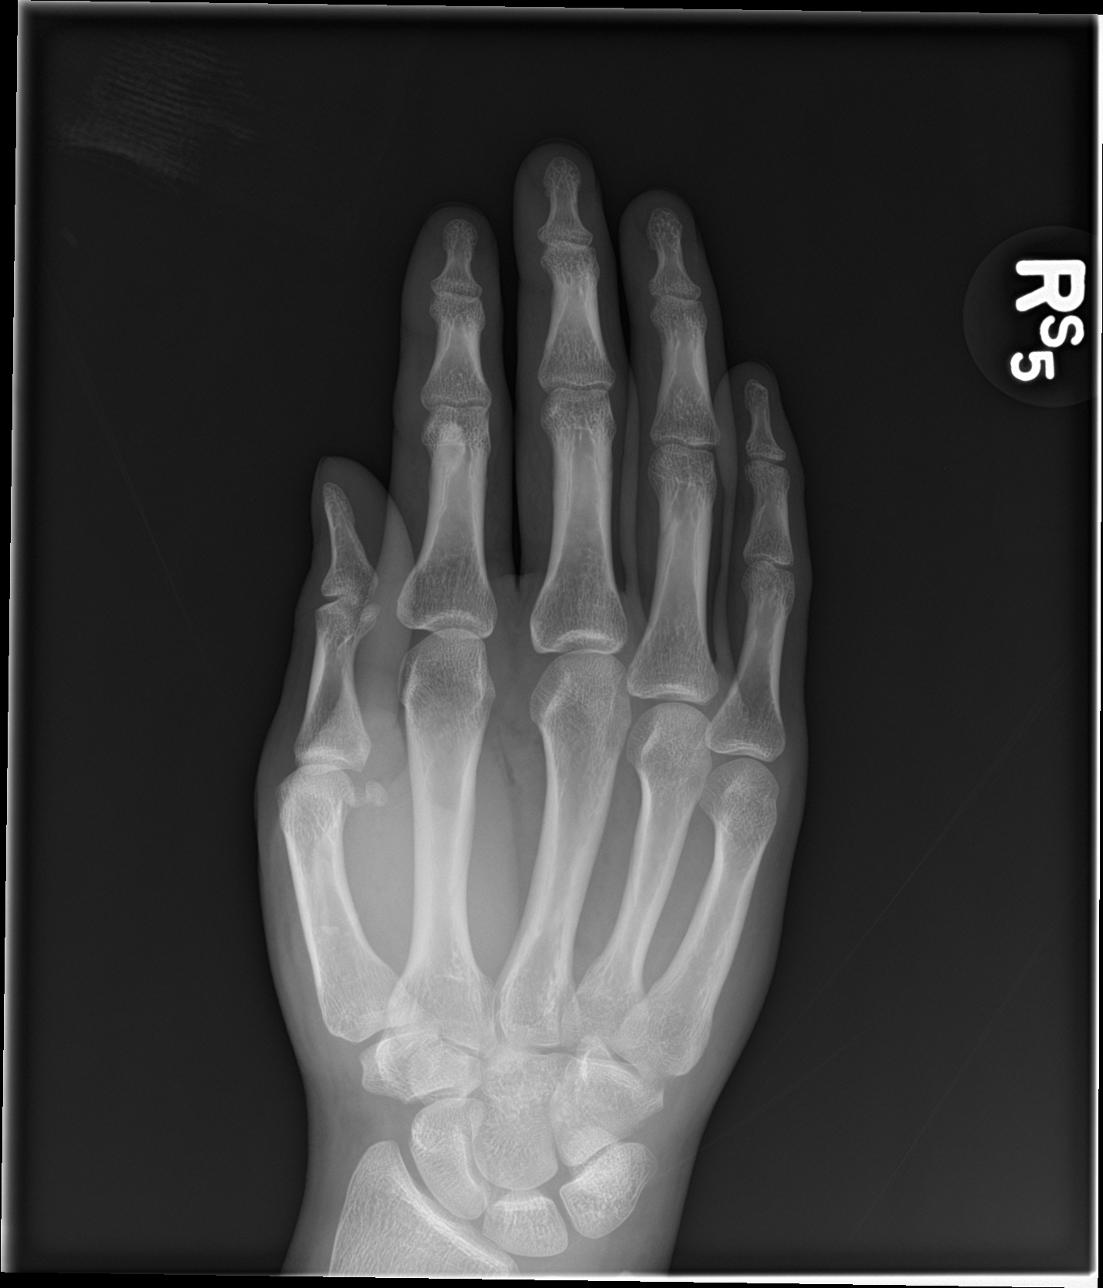

[hand obl (1 of 2)]
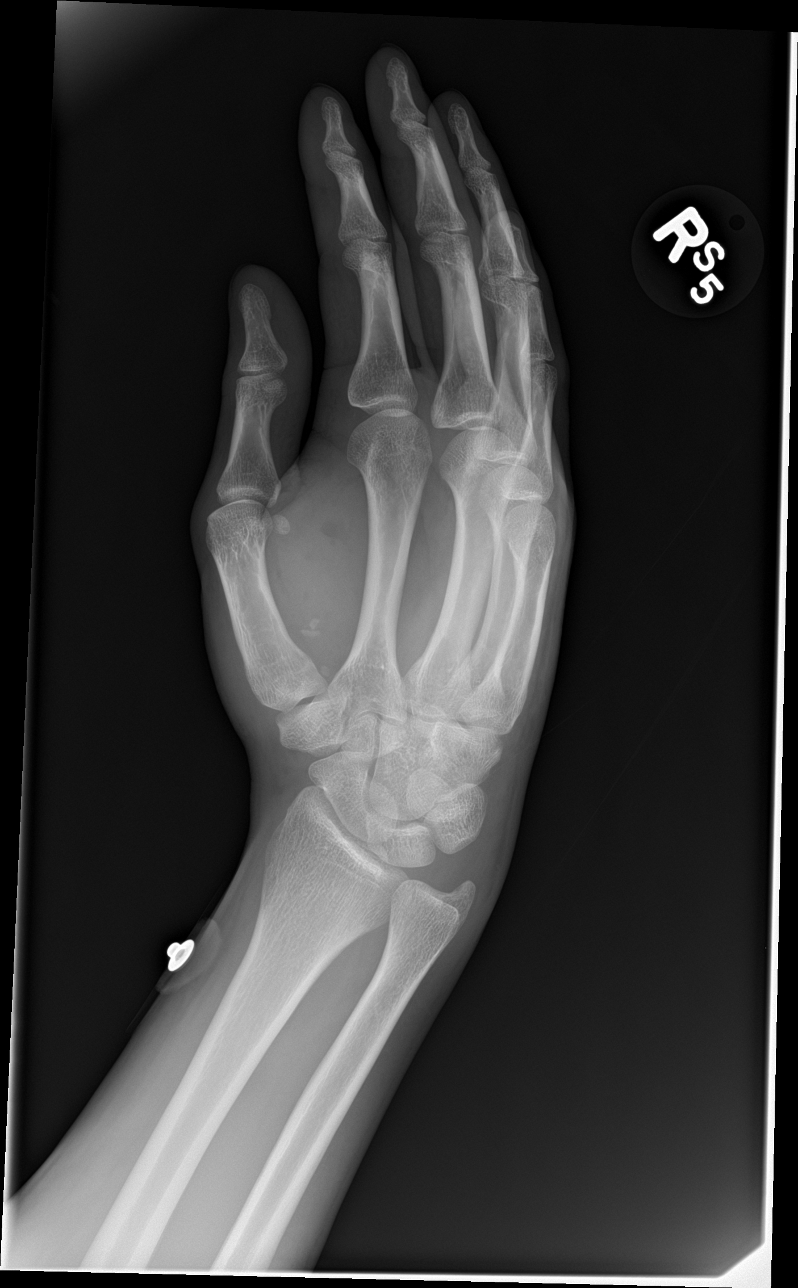

[hand lat]
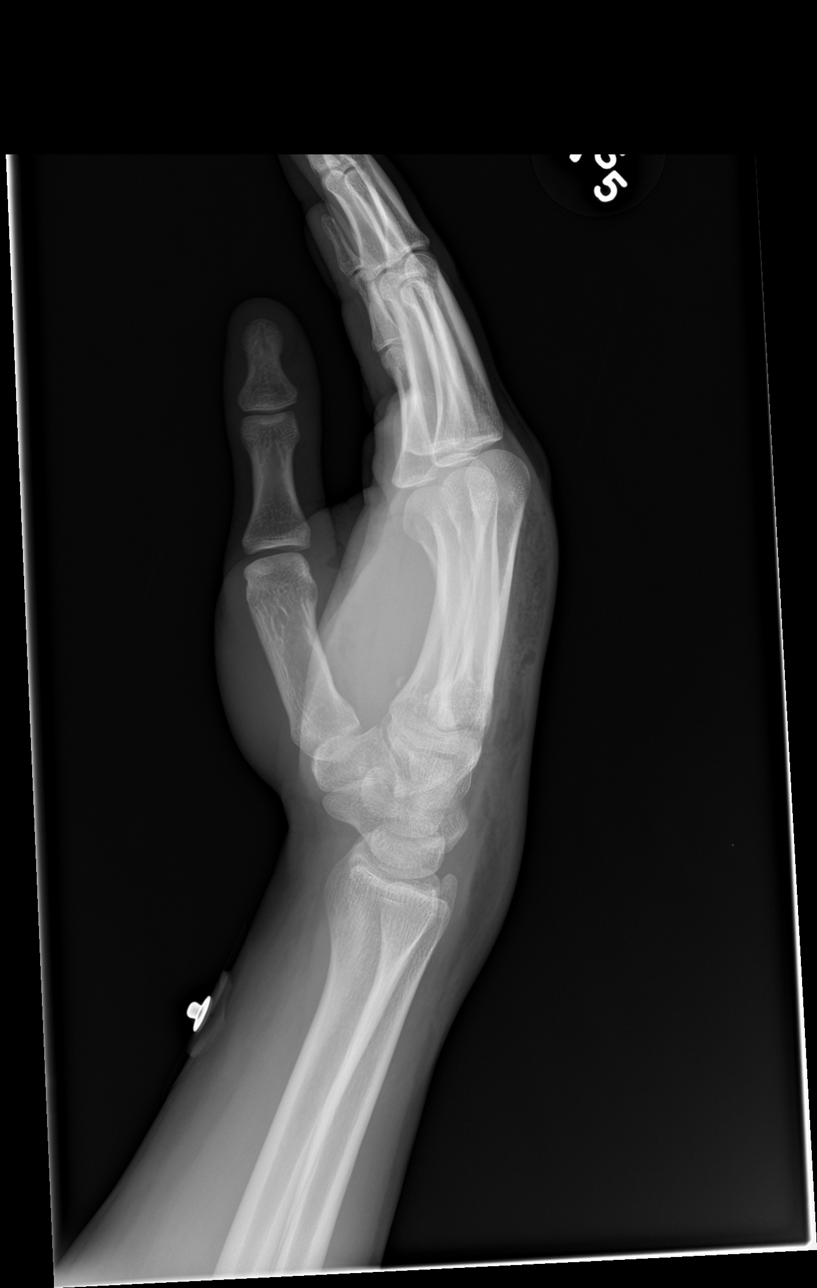

[hand obl (2 of 2)]
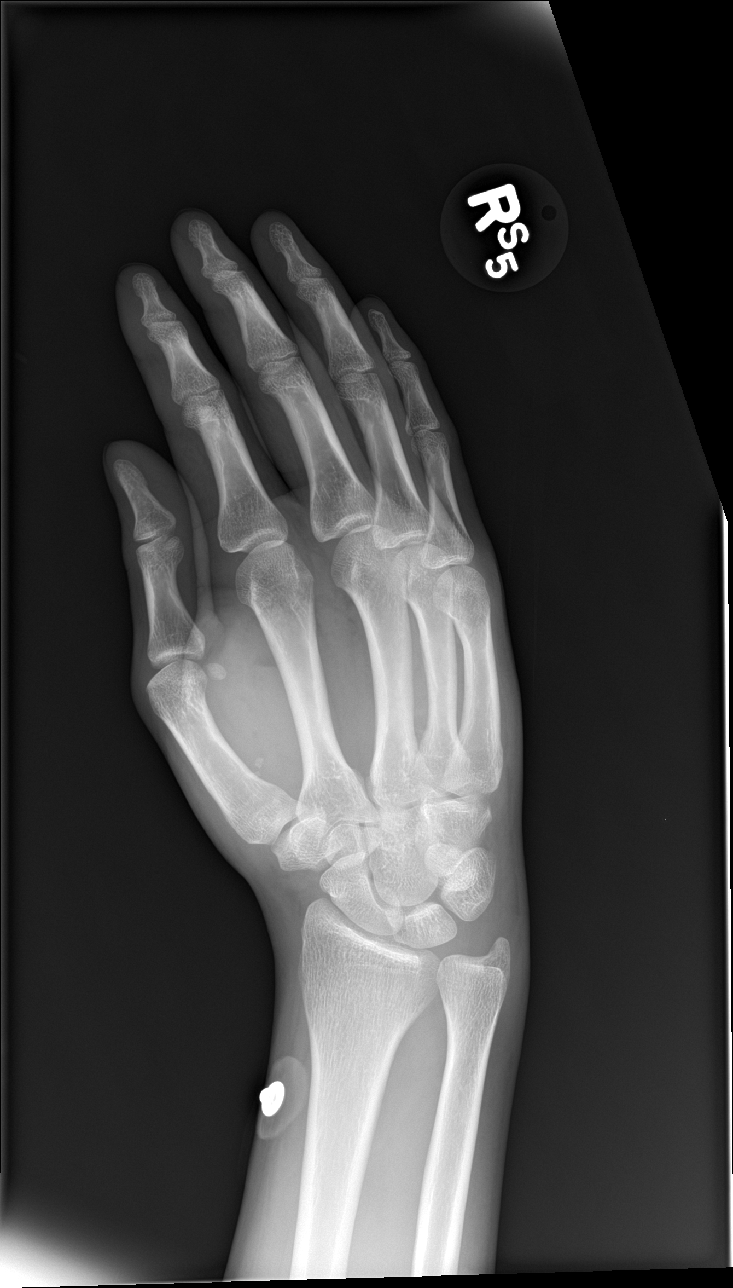

[4 of 4 positions shown; findings below may reference images not displayed]

FINDINGS: Diffuse soft tissue swelling greatest dorsally.

Osseous mineralization normal.

Small radiopaque foreign bodies are identified at the thenar
eminence, largest 4 mm diameter.

Few foci of nonspecific soft tissue gas identified.

No acute fracture, dislocation, or bone destruction.
IMPRESSION: Soft tissue swelling with few scattered foci of soft tissue gas
compatible with penetrating injury.

Small radiopaque foreign bodies at thenar eminence largest 4 mm
diameter.

No definite acute osseous abnormalities.

## 2017-04-10 ENCOUNTER — Encounter (HOSPITAL_BASED_OUTPATIENT_CLINIC_OR_DEPARTMENT_OTHER): Payer: Self-pay | Admitting: *Deleted

## 2017-04-11 ENCOUNTER — Other Ambulatory Visit: Payer: Self-pay | Admitting: Orthopedic Surgery

## 2017-04-16 ENCOUNTER — Ambulatory Visit (HOSPITAL_BASED_OUTPATIENT_CLINIC_OR_DEPARTMENT_OTHER): Payer: Worker's Compensation | Admitting: Anesthesiology

## 2017-04-16 ENCOUNTER — Ambulatory Visit (HOSPITAL_BASED_OUTPATIENT_CLINIC_OR_DEPARTMENT_OTHER)
Admission: RE | Admit: 2017-04-16 | Discharge: 2017-04-16 | Disposition: A | Discharge status: Home / Self Care | Payer: Worker's Compensation | Source: Ambulatory Visit | Attending: Orthopedic Surgery | Admitting: Orthopedic Surgery

## 2017-04-16 ENCOUNTER — Encounter (HOSPITAL_BASED_OUTPATIENT_CLINIC_OR_DEPARTMENT_OTHER): Payer: Self-pay | Admitting: *Deleted

## 2017-04-16 ENCOUNTER — Encounter (HOSPITAL_BASED_OUTPATIENT_CLINIC_OR_DEPARTMENT_OTHER)
Admission: RE | Disposition: A | Discharge status: Home / Self Care | Payer: Self-pay | Source: Ambulatory Visit | Attending: Orthopedic Surgery

## 2017-04-16 DIAGNOSIS — M25841 Other specified joint disorders, right hand: Secondary | ICD-10-CM | POA: Diagnosis present

## 2017-04-16 DIAGNOSIS — W3189XD Contact with other specified machinery, subsequent encounter: Secondary | ICD-10-CM | POA: Diagnosis not present

## 2017-04-16 DIAGNOSIS — S67190D Crushing injury of right index finger, subsequent encounter: Secondary | ICD-10-CM | POA: Diagnosis not present

## 2017-04-16 DIAGNOSIS — S6701XD Crushing injury of right thumb, subsequent encounter: Secondary | ICD-10-CM | POA: Insufficient documentation

## 2017-04-16 HISTORY — DX: Nausea with vomiting, unspecified: R11.2

## 2017-04-16 HISTORY — DX: Other specified postprocedural states: Z98.890

## 2017-04-16 HISTORY — PX: MASS EXCISION: SHX2000

## 2017-04-16 HISTORY — PX: SESMOIDECTOMY: SHX6205

## 2017-04-16 SURGERY — SESMOIDECTOMY
Anesthesia: General | Site: Hand | Laterality: Right

## 2017-04-16 MED ORDER — HYDROMORPHONE HCL 1 MG/ML IJ SOLN
INTRAMUSCULAR | Status: AC
  Filled 2017-04-16: qty 0.5

## 2017-04-16 MED ORDER — BUPIVACAINE HCL (PF) 0.25 % IJ SOLN
INTRAMUSCULAR | Status: AC
  Filled 2017-04-16: qty 30

## 2017-04-16 MED ORDER — PROPOFOL 10 MG/ML IV BOLUS
INTRAVENOUS | Status: AC
  Filled 2017-04-16: qty 20

## 2017-04-16 MED ORDER — SCOPOLAMINE 1 MG/3DAYS TD PT72
1.0000 | MEDICATED_PATCH | Freq: Once | TRANSDERMAL | Status: DC | PRN
  Administered 2017-04-16: 1.5 mg via TRANSDERMAL

## 2017-04-16 MED ORDER — LIDOCAINE 2% (20 MG/ML) 5 ML SYRINGE
INTRAMUSCULAR | Status: AC
  Filled 2017-04-16: qty 5

## 2017-04-16 MED ORDER — HYDROMORPHONE HCL 1 MG/ML IJ SOLN
0.2500 mg | INTRAMUSCULAR | Status: DC | PRN
  Administered 2017-04-16 (×2): 0.5 mg via INTRAVENOUS

## 2017-04-16 MED ORDER — BUPIVACAINE-EPINEPHRINE (PF) 0.5% -1:200000 IJ SOLN
INTRAMUSCULAR | Status: DC | PRN
  Administered 2017-04-16: 25 mL via PERINEURAL

## 2017-04-16 MED ORDER — CEFAZOLIN SODIUM-DEXTROSE 2-4 GM/100ML-% IV SOLN
INTRAVENOUS | Status: AC
  Filled 2017-04-16: qty 100

## 2017-04-16 MED ORDER — CHLORHEXIDINE GLUCONATE 4 % EX LIQD: 60.0000 mL | Freq: Once | CUTANEOUS | Status: DC

## 2017-04-16 MED ORDER — SCOPOLAMINE 1 MG/3DAYS TD PT72
MEDICATED_PATCH | TRANSDERMAL | Status: AC
  Filled 2017-04-16: qty 1

## 2017-04-16 MED ORDER — PROPOFOL 10 MG/ML IV BOLUS
INTRAVENOUS | Status: DC | PRN
  Administered 2017-04-16: 50 mg via INTRAVENOUS
  Administered 2017-04-16: 200 mg via INTRAVENOUS

## 2017-04-16 MED ORDER — FENTANYL CITRATE (PF) 100 MCG/2ML IJ SOLN
50.0000 ug | INTRAMUSCULAR | Status: AC | PRN
  Administered 2017-04-16: 50 ug via INTRAVENOUS
  Administered 2017-04-16: 25 ug via INTRAVENOUS
  Administered 2017-04-16: 100 ug via INTRAVENOUS

## 2017-04-16 MED ORDER — CEFAZOLIN SODIUM-DEXTROSE 2-4 GM/100ML-% IV SOLN
2.0000 g | INTRAVENOUS | Status: AC
  Administered 2017-04-16: 2 g via INTRAVENOUS

## 2017-04-16 MED ORDER — OXYCODONE-ACETAMINOPHEN 5-325 MG PO TABS: 1.0000 | ORAL_TABLET | ORAL | 0 refills | Status: AC | PRN

## 2017-04-16 MED ORDER — LIDOCAINE 2% (20 MG/ML) 5 ML SYRINGE
INTRAMUSCULAR | Status: DC | PRN
  Administered 2017-04-16: 60 mg via INTRAVENOUS

## 2017-04-16 MED ORDER — MIDAZOLAM HCL 2 MG/2ML IJ SOLN
1.0000 mg | INTRAMUSCULAR | Status: DC | PRN
  Administered 2017-04-16: 2 mg via INTRAVENOUS

## 2017-04-16 MED ORDER — MIDAZOLAM HCL 2 MG/2ML IJ SOLN
INTRAMUSCULAR | Status: AC
  Filled 2017-04-16: qty 2

## 2017-04-16 MED ORDER — LACTATED RINGERS IV SOLN
INTRAVENOUS | Status: DC
  Administered 2017-04-16 (×2): via INTRAVENOUS

## 2017-04-16 MED ORDER — FENTANYL CITRATE (PF) 100 MCG/2ML IJ SOLN
INTRAMUSCULAR | Status: AC
  Filled 2017-04-16: qty 2

## 2017-04-16 MED ORDER — DEXAMETHASONE SODIUM PHOSPHATE 10 MG/ML IJ SOLN
INTRAMUSCULAR | Status: AC
  Filled 2017-04-16: qty 1

## 2017-04-16 MED ORDER — DEXAMETHASONE SODIUM PHOSPHATE 4 MG/ML IJ SOLN
INTRAMUSCULAR | Status: DC | PRN
  Administered 2017-04-16: 10 mg via INTRAVENOUS

## 2017-04-16 MED ORDER — PROMETHAZINE HCL 25 MG/ML IJ SOLN: 6.2500 mg | INTRAMUSCULAR | Status: DC | PRN

## 2017-04-16 SURGICAL SUPPLY — 53 items
BAG DECANTER FOR FLEXI CONT (MISCELLANEOUS) IMPLANT
BLADE MINI RND TIP GREEN BEAV (BLADE) ×8 IMPLANT
BLADE SURG 15 STRL LF DISP TIS (BLADE) ×2 IMPLANT
BLADE SURG 15 STRL SS (BLADE) ×2
BNDG COHESIVE 3X5 TAN STRL LF (GAUZE/BANDAGES/DRESSINGS) ×4 IMPLANT
BNDG ESMARK 4X9 LF (GAUZE/BANDAGES/DRESSINGS) ×4 IMPLANT
BNDG GAUZE ELAST 4 BULKY (GAUZE/BANDAGES/DRESSINGS) ×4 IMPLANT
CHLORAPREP W/TINT 26ML (MISCELLANEOUS) ×4 IMPLANT
CORDS BIPOLAR (ELECTRODE) ×4 IMPLANT
COVER BACK TABLE 60X90IN (DRAPES) ×4 IMPLANT
COVER MAYO STAND STRL (DRAPES) ×4 IMPLANT
CUFF TOURNIQUET SINGLE 18IN (TOURNIQUET CUFF) ×4 IMPLANT
DECANTER SPIKE VIAL GLASS SM (MISCELLANEOUS) IMPLANT
DRAPE EXTREMITY T 121X128X90 (DRAPE) ×4 IMPLANT
DRAPE SURG 17X23 STRL (DRAPES) ×4 IMPLANT
DRSG PAD ABDOMINAL 8X10 ST (GAUZE/BANDAGES/DRESSINGS) ×8 IMPLANT
GAUZE SPONGE 4X4 12PLY STRL (GAUZE/BANDAGES/DRESSINGS) ×4 IMPLANT
GAUZE XEROFORM 1X8 LF (GAUZE/BANDAGES/DRESSINGS) ×4 IMPLANT
GLOVE BIOGEL PI IND STRL 7.0 (GLOVE) ×2 IMPLANT
GLOVE BIOGEL PI IND STRL 8.5 (GLOVE) ×2 IMPLANT
GLOVE BIOGEL PI INDICATOR 7.0 (GLOVE) ×2
GLOVE BIOGEL PI INDICATOR 8.5 (GLOVE) ×2
GLOVE ECLIPSE 6.5 STRL STRAW (GLOVE) ×8 IMPLANT
GLOVE SURG ORTHO 8.0 STRL STRW (GLOVE) ×4 IMPLANT
GOWN STRL REUS W/ TWL LRG LVL3 (GOWN DISPOSABLE) ×2 IMPLANT
GOWN STRL REUS W/TWL LRG LVL3 (GOWN DISPOSABLE) ×2
GOWN STRL REUS W/TWL XL LVL3 (GOWN DISPOSABLE) ×4 IMPLANT
LOOP VESSEL MAXI BLUE (MISCELLANEOUS) ×4 IMPLANT
NEEDLE KEITH (NEEDLE) IMPLANT
NEEDLE PRECISIONGLIDE 27X1.5 (NEEDLE) ×4 IMPLANT
NS IRRIG 1000ML POUR BTL (IV SOLUTION) ×4 IMPLANT
PACK BASIN DAY SURGERY FS (CUSTOM PROCEDURE TRAY) ×4 IMPLANT
PAD CAST 3X4 CTTN HI CHSV (CAST SUPPLIES) ×2 IMPLANT
PADDING CAST ABS 4INX4YD NS (CAST SUPPLIES) ×2
PADDING CAST ABS COTTON 4X4 ST (CAST SUPPLIES) ×2 IMPLANT
PADDING CAST COTTON 3X4 STRL (CAST SUPPLIES) ×2
SLEEVE SCD COMPRESS KNEE MED (MISCELLANEOUS) ×4 IMPLANT
SPLINT PLASTER CAST XFAST 3X15 (CAST SUPPLIES) ×20 IMPLANT
SPLINT PLASTER XTRA FASTSET 3X (CAST SUPPLIES) ×20
STOCKINETTE 4X48 STRL (DRAPES) ×4 IMPLANT
SUT ETHILON 4 0 PS 2 18 (SUTURE) ×8 IMPLANT
SUT MERSILENE 4 0 P 3 (SUTURE) IMPLANT
SUT SILK 4 0 PS 2 (SUTURE) IMPLANT
SUT VIC AB 4-0 P-3 18XBRD (SUTURE) IMPLANT
SUT VIC AB 4-0 P3 18 (SUTURE)
SUT VICRYL 4-0 PS2 18IN ABS (SUTURE) IMPLANT
SWAB COLLECTION DEVICE MRSA (MISCELLANEOUS) ×4 IMPLANT
SWAB CULTURE ESWAB REG 1ML (MISCELLANEOUS) ×4 IMPLANT
SYR BULB 3OZ (MISCELLANEOUS) ×4 IMPLANT
SYR CONTROL 10ML LL (SYRINGE) ×4 IMPLANT
TOWEL OR 17X24 6PK STRL BLUE (TOWEL DISPOSABLE) ×8 IMPLANT
TUBE FEEDING ENTERAL 5FR 16IN (TUBING) IMPLANT
UNDERPAD 30X30 (UNDERPADS AND DIAPERS) IMPLANT

## 2017-04-16 NOTE — Anesthesia Procedure Notes (Signed)
Procedure Name: LMA Insertion Date/Time: 04/16/2017 11:03 AM Performed by: Gar GibbonKEETON, [place] Yearby S Pre-anesthesia Checklist: Patient identified, Emergency Drugs available, Suction available and Patient being monitored Patient Re-evaluated:Patient Re-evaluated prior to inductionOxygen Delivery Method: Circle system utilized Preoxygenation: Pre-oxygenation with 100% oxygen Intubation Type: IV induction Ventilation: Mask ventilation without difficulty LMA: LMA inserted LMA Size: 4.0 Number of attempts: 1 Airway Equipment and Method: Bite block Placement Confirmation: positive ETCO2 Tube secured with: Tape Dental Injury: Teeth and Oropharynx as per pre-operative assessment

## 2017-04-16 NOTE — Anesthesia Preprocedure Evaluation (Addendum)
Anesthesia Evaluation  Patient identified by MRN, date of birth, ID band Patient awake    Reviewed: Allergy & Precautions, NPO status , Patient's Chart, lab work & pertinent test results  History of Anesthesia Complications (+) PONV  Airway Mallampati: I   Neck ROM: Full    Dental no notable dental hx.    Pulmonary neg pulmonary ROS,    breath sounds clear to auscultation       Cardiovascular negative cardio ROS   Rhythm:Regular Rate:Normal     Neuro/Psych negative neurological ROS  negative psych ROS   GI/Hepatic negative GI ROS, Neg liver ROS,   Endo/Other  negative endocrine ROS  Renal/GU negative Renal ROS  negative genitourinary   Musculoskeletal negative musculoskeletal ROS (+)   Abdominal   Peds negative pediatric ROS (+)  Hematology negative hematology ROS (+)   Anesthesia Other Findings   Reproductive/Obstetrics negative OB ROS                            Anesthesia Physical Anesthesia Plan  ASA: I  Anesthesia Plan: General   Post-op Pain Management:    Induction: Intravenous  PONV Risk Score and Plan: 4 or greater and Ondansetron, Dexamethasone, Propofol and Midazolam  Airway Management Planned: LMA  Additional Equipment:   Intra-op Plan:   Post-operative Plan: Extubation in OR  Informed Consent: I have reviewed the patients History and Physical, chart, labs and discussed the procedure including the risks, benefits and alternatives for the proposed anesthesia with the patient or authorized representative who has indicated his/her understanding and acceptance.   Dental advisory given  Plan Discussed with: CRNA  Anesthesia Plan Comments:        Anesthesia Quick Evaluation

## 2017-04-16 NOTE — Anesthesia Procedure Notes (Addendum)
Anesthesia Regional Block: Supraclavicular block   Pre-Anesthetic Checklist: ,, timeout performed, Correct Patient, Correct Site, Correct Laterality, Correct Procedure, Correct Position, site marked, Risks and benefits discussed,  Surgical consent,  Pre-op evaluation,  At surgeon's request and post-op pain management  Laterality: Right and Upper  Prep: chloraprep       Needles:   Needle Type: Echogenic Stimulator Needle     Needle Length: 9cm  Needle Gauge: 21   Needle insertion depth: 4 cm   Additional Needles:   Procedures: ultrasound guided,,,,,,,,  Narrative:  Start time: 04/16/2017 12:30 PM End time: 04/16/2017 12:42 PM Injection made incrementally with aspirations every 5 mL.  Performed by: Personally  Anesthesiologist: Nikkita Adeyemi  Additional Notes: Done in OR at end of surgery. Discussed plan c dad and patient

## 2017-04-16 NOTE — H&P (Signed)
Ernest Adams is an 26 y.o. male.   Chief Complaint: catching right thumb and mass dorsum right hand HPI: Ernest Adams is a 26 year old right-hand-dominant male referred for consultation second opinion regarding an injury he sustained to his right hand while at work. The injury occurred February 02, 2016. This was a hydraulic injection type injury into his first webspace. He was seen on the day of injury by Dr. Mack Hook and treated operatively for this. He underwent debridements. Carpal tunnel release. Removal of foreign material per he has had therapy afterwards and has progressed well from this. He complains however of a shooting pain in the thenar eminence. With a feeling of triggering, and popping in his thumb. Dr. Janee Morn diagnosed a trigger thumb and injected this on 2 occasions which gave him temporary relief. He complains of a VAS score of 7/10 stinging in nature. He states gripping or wide pinch are causes discomfort for him. Is not presently taking anything for this. He is at regular work. He has no prior history of injuries. He has no history diabetes thyroid problems arthritis gout. Family history is negative for each of these also. He was sent for MRI at that time. This been done revealing a fluid collection of the dorsal aspect of the extensor tendons of his right hand and mild degenerative changes at the metacarpal phalangeal joint with a sesamoid bone in his right metacarpal phalangeal joint. Continues to complain of moderate pain. He is complaining of discomfort with flexion extension of his thumb flexion-extension of his fingers tenderness over the dorsal aspect of his hand           Past Medical History:  Diagnosis Date  . PONV (postoperative nausea and vomiting)     Past Surgical History:  Procedure Laterality Date  . I&D EXTREMITY Right 02/02/2016   Procedure: IRRIGATION AND DEBRIDEMENT EXTREMITY;  Surgeon: Mack Hook, MD;  Location: [facility] OR;  Service: Orthopedics;   Laterality: Right;    History reviewed. No pertinent family history. Social History:  reports that he has never smoked. He has never used smokeless tobacco. He reports that he drinks alcohol. He reports that he does not use drugs.  Allergies:  Allergies  Allergen Reactions  . Bee Venom Anaphylaxis    Bee stings    No prescriptions prior to admission.    No results found for this or any previous visit (from the past 48 hour(s)).  No results found.   Pertinent items are noted in HPI.  Height 5\' 9"  (1.753 m), weight 65.8 kg (145 lb).  General appearance: alert, cooperative and appears stated age Head: Normocephalic, without obvious abnormality Neck: no carotid bruit and no JVD Resp: clear to auscultation bilaterally Cardio: regular rate and rhythm, S1, S2 normal, no murmur, click, rub or gallop GI: soft, non-tender; bowel sounds normal; no masses,  no organomegaly Extremities: catching right thumb and mass dorsum right hand Pulses: 2+ and symmetric Skin: Skin color, texture, turgor normal. No rashes or lesions Neurologic: Grossly normal Incision/Wound: na  Assessment/Plan  Assessment:  1. Sesamoiditis  2. Tenosynovitis extensor tendons   Plan: We have discussed tenosynovectomy with him along with the removal of the sesamoid. He has pain with pressure over the metacarpal phalangeal joint volarly reproducing his discomfort for him. He has no pain when this is relieved with flexion extension of his finger. This is a sesamoiditis and a tenosynovitis of the extensor tendons. Prepare postoperative course are discussed along with risks and complications. Is aware that there is  no guarantee to the surgery the possibility of infection recurrence injury to arteries nerves tendons complete relief symptoms dystrophy possibility of further stiffness and scarring especially dorsally. He would like to proceed and is scheduled for excision sesamoid bone metacarpal phalangeal joint right  thumb excision tenosynovial tissue extensor tendons right hand.       [name] R 04/16/2017, 9:10 AM

## 2017-04-16 NOTE — Op Note (Signed)
Dictation Number [id number]

## 2017-04-16 NOTE — Transfer of Care (Signed)
Immediate Anesthesia Transfer of Care Note  Patient: Ernest Adams  Procedure(s) Performed: Procedure(s): SESMOIDECTOMY METACARPAL PHALANGEAL RIGHT THUMB (Right) Excision mass extensor tendons right hand (Right)  Patient Location: PACU  Anesthesia Type:GA combined with regional for post-op pain  Level of Consciousness: awake and sedated  Airway & Oxygen Therapy: Patient Spontanous Breathing and Patient connected to face mask oxygen  Post-op Assessment: Report given to RN and Post -op Vital signs reviewed and stable  Post vital signs: Reviewed and stable  Last Vitals:  Vitals:   04/16/17 0959 04/16/17 1252  BP: 125/77   Pulse: (!) 56 71  Resp: 16 13  Temp: 36.5 C     Last Pain:  Vitals:   04/16/17 0959  TempSrc: Oral         Complications: No apparent anesthesia complications

## 2017-04-16 NOTE — Discharge Instructions (Addendum)
° °  ° ° ° °[facility] Center Instructions °Hand Surgery ° °Wound Care: °Keep your hand elevated above the level of your heart.  Do not allow it to dangle by your side.  Keep the dressing dry and do not remove it unless your doctor advises you to do so.  He will usually change it at the time of your post-op visit.  Moving your fingers is advised to stimulate circulation but will depend on the site of your surgery.  If you have a splint applied, your doctor will advise you regarding movement. ° °Activity: °Do not drive or operate machinery today.  Rest today and then you may return to your normal activity and work as indicated by your physician. ° °Diet:  °Drink liquids today or eat a light diet.  You may resume a regular diet tomorrow.   ° °General expectations: °Pain for two to three days. °Fingers may become slightly swollen. ° °Call your doctor if any of the following occur: °Severe pain not relieved by pain medication. °Elevated temperature. °Dressing soaked with blood. °Inability to move fingers. °White or bluish color to fingers. ° ° °Regional Anesthesia Blocks ° °1. Numbness or the inability to move the "blocked" extremity may last from 3-48 hours after placement. The length of time depends on the medication injected and your individual response to the medication. If the numbness is not going away after 48 hours, call your surgeon. ° °2. The extremity that is blocked will need to be protected until the numbness is gone and the  Strength has returned. Because you cannot feel it, you will need to take extra care to avoid injury. Because it may be weak, you may have difficulty moving it or using it. You may not know what position it is in without looking at it while the block is in effect. ° °3. For blocks in the legs and feet, returning to weight bearing and walking needs to be done carefully. You will need to wait until the numbness is entirely gone and the strength has returned. You should be able to move your leg  and foot normally before you try and bear weight or walk. You will need someone to be with you when you first try to ensure you do not fall and possibly risk injury. ° °4. Bruising and tenderness at the needle site are common side effects and will resolve in a few days. ° °5. Persistent numbness or new problems with movement should be communicated to the surgeon or the [facility] ([phone])/ [facility] ([phone]). ° ° ° °Post Anesthesia Home Care Instructions ° °Activity: °Get plenty of rest for the remainder of the day. A responsible individual must stay with you for 24 hours following the procedure.  °For the next 24 hours, DO NOT: °-Drive a car °-Operate machinery °-Drink alcoholic beverages °-Take any medication unless instructed by your physician °-Make any legal decisions or sign important papers. ° °Meals: °Start with liquid foods such as gelatin or soup. Progress to regular foods as tolerated. Avoid greasy, spicy, heavy foods. If nausea and/or vomiting occur, drink only clear liquids until the nausea and/or vomiting subsides. Call your physician if vomiting continues. ° °Special Instructions/Symptoms: °Your throat may feel dry or sore from the anesthesia or the breathing tube placed in your throat during surgery. If this causes discomfort, gargle with warm salt water. The discomfort should disappear within 24 hours. ° °If you had a scopolamine patch placed behind your ear for   the management of post- operative nausea and/or vomiting: ° °1. The medication in the patch is effective for 72 hours, after which it should be removed.  Wrap patch in a tissue and discard in the trash. Wash hands thoroughly with soap and water. °2. You may remove the patch earlier than 72 hours if you experience unpleasant side effects which may include dry mouth, dizziness or visual disturbances. °3. Avoid touching the patch. Wash your hands with soap and water after contact with the patch. °  ° ° °

## 2017-04-16 NOTE — Anesthesia Postprocedure Evaluation (Signed)
Anesthesia Post Note  Patient: Ardeen GarlandMichael Purrington  Procedure(s) Performed: Procedure(s) (LRB): SESMOIDECTOMY METACARPAL PHALANGEAL RIGHT THUMB (Right) Excision mass extensor tendons right hand (Right)     Patient location during evaluation: PACU Anesthesia Type: General and Regional Level of consciousness: awake and alert Pain management: pain level controlled Vital Signs Assessment: post-procedure vital signs reviewed and stable Respiratory status: spontaneous breathing, nonlabored ventilation, respiratory function stable and patient connected to nasal cannula oxygen Cardiovascular status: blood pressure returned to baseline and stable Postop Assessment: no signs of nausea or vomiting Anesthetic complications: no    Last Vitals:  Vitals:   04/16/17 1330 04/16/17 1400  BP: 112/71 (!) 119/58  Pulse: 83 85  Resp: 12 14  Temp:  37 C    Last Pain:  Vitals:   04/16/17 1400  TempSrc:   PainSc: 3                  Zainab Crumrine,JAMES TERRILL

## 2017-04-16 NOTE — Brief Op Note (Signed)
04/16/2017  12:34 PM  PATIENT:  Ernest Adams  26 y.o. male  PRE-OPERATIVE DIAGNOSIS:  SESMOIDITIS RIGHT THUMB, MASS EXTENSOR RIGHT HAND  POST-OPERATIVE DIAGNOSIS:  SESMOIDITIS RIGHT THUMB, MASS EXTENSOR RIGHT HAND  PROCEDURE:  Procedure(s): SESMOIDECTOMY METACARPAL PHALANGEAL RIGHT THUMB (Right) TENOSYNOVECTOMY EXTENSOR TENDONS RIGHT HAND (Right)  SURGEON:  Surgeon(s) and Role:    Cindee Salt* Mateo Overbeck, MD - Primary  PHYSICIAN ASSISTANT:   ASSISTANTS: none   ANESTHESIA:   regional and general  EBL:  Total I/O In: 1000 [I.V.:1000] Out: -   BLOOD ADMINISTERED:none  DRAINS: Penrose drain in the dorsum of the hand   LOCAL MEDICATIONS USED:  NONE  SPECIMEN:  Excision  DISPOSITION OF SPECIMEN:  PATHOLOGY  COUNTS:  YES  TOURNIQUET:   Total Tourniquet Time Documented: Upper Arm (Right) - 77 minutes Total: Upper Arm (Right) - 77 minutes   DICTATION: .Other Dictation: Dictation Number [id number]  PLAN OF CARE: Discharge to home after PACU  PATIENT DISPOSITION:  PACU - hemodynamically stable.

## 2017-04-17 ENCOUNTER — Encounter (HOSPITAL_BASED_OUTPATIENT_CLINIC_OR_DEPARTMENT_OTHER): Payer: Self-pay | Admitting: Orthopedic Surgery

## 2017-04-21 LAB — AEROBIC/ANAEROBIC CULTURE (SURGICAL/DEEP WOUND)

## 2017-04-21 LAB — AEROBIC/ANAEROBIC CULTURE W GRAM STAIN (SURGICAL/DEEP WOUND)
Culture: NO GROWTH
Gram Stain: NONE SEEN

## 2017-04-22 NOTE — Op Note (Signed)
NAMEMATTHIAS, BOGUS            ACCOUNT NO.:  [id number]  MEDICAL RECORD NO.:  [id number]  LOCATION:                                 FACILITY:  PHYSICIAN:  Cindee Salt, M.D.       DATE OF BIRTH:  [birth year]  DATE OF PROCEDURE:  04/16/2017 DATE OF DISCHARGE:                              OPERATIVE REPORT   PREOPERATIVE DIAGNOSIS:  Status post crush injection injury, right hand.  POSTOPERATIVE DIAGNOSIS:  Status post crush injection injury, right hand.  OPERATION:  Sesamoidectomy, metacarpophalangeal joint, right thumb with excision mass, dorsal aspect of the right hand.  SURGEON:  Cindee Salt, MD.  ASSISTANT:  None.  ANESTHESIA:  General with supraclavicular block.  PLACE OF SURGERY:  [facility].  ANESTHESIOLOGIST:  Burna Forts, M.D.  HISTORY:  The patient is a 26 year old male with a history of crush injection injury of his right hand.  He has undergone procedures elsewhere, he has had pain with catching of the metacarpophalangeal joint of his right thumb and a mass on the dorsal aspect of his hand. This has not responded to conservative treatment.  MRI reveals an irregular mass on the dorsal aspect of his hand separated from the extensor tendons.  He is desirous of having this removed.  He is scheduled for excision of a sesamoid and that he has catching of the thumb increased with any pressure over that sesamoid with flexion and extension of thumb.  Injections have not responded appropriately to relieve his pain nor has nonsteroidal anti-inflammatories or pain medicine.  He is aware that there is no guarantee to the surgery; the possibility of infection; recurrence of injury to arteries, nerves, tendons; incomplete relief of symptoms; dystrophy.  In the preoperative area, the patient is seen, the extremity marked by both patient and surgeon.  Antibiotic given.  PROCEDURE IN DETAIL:  The patient was brought to the operating room, where general  anesthetic was carried out without difficulty.  He was prepped using ChloraPrep, in a supine position with the right arm free. A 3-minute dry time was allowed.  Time-out taken, confirming the patient and procedure.  The limb was exsanguinated with an Esmarch bandage. Tourniquet placed high, and the arm was inflated to 250 mmHg.  The sesamoid was approached first.  A volar Bruner incision was made over the metacarpophalangeal joint thenar eminence of his right thumb.  This was carried down through subcutaneous tissue.  Bleeders were electrocauterized with bipolar.  Radial and ulnar neurovascular bundles were identified.  The A1 pulley was then incised.  The large sesamoid on the ulnar aspect was easily able to be identified.  A Beaver blade was then used to incise the volar plate, and with blunt and sharp dissection, the sesamoid was excised and sent to Pathology.  Following removal of the sesamoid, there was no further catching with pressure over the metacarpophalangeal joint in flexion and extension.  There was no triggering of his thumb.  The wound was copiously irrigated with saline.  The skin was closed with interrupted 4-0 nylon sutures.  A longitudinal incision was then made on the dorsum of the hand.  This was from the metacarpophalangeal joint to the  wrist.  This carried down through subcutaneous tissue.  A large very thick mass was immediately encountered.  This was incised and drained a very thin bluish turquoise fluid from this.  This was multilobulated.  There were several small areas which were not opened.  The dissection was carried proximally identifying the extensor tendons at the distal portion of the retinaculum.  The mass was then excised, trying to protect the neurovascular bundles as much as possible.  This was extremely difficult due to the scarring of the entire mass to the skin and to the tendons palmarly.  The cultures were taken for both aerobic and  anaerobic, fungal, and AFB cultures.  The mass was followed distally, separating it from the tendons.  This was a creamy white mass which on cutting left a whitish very thin fluid on the scissors.  The mass was excised all the way onto the proximal phalanges of the middle, index, ring, and small fingers.  The further small portions were dissected free taking care to protect each of the extensor tendons.  At the completion of the excision of the mass, the wrist was flexed and all fingers immediately extended. Each of the tendons was checked for continuity and found to be intact straightening the fingers.  This included the 2 extensors to the index finger, there was only a very small extensor digiti quinti and a large juncture going to the small finger.  This allowed complete extension of the finger and a positive tenodesis was present in all the fingers.  The wound was copiously irrigated with saline.  The mass measured 10 cm x 5 cm.  This was sent to Pathology.  A second small one with the unopened bluish fluid containing area was sent separately.  The sesamoid was sent.  Vessel loop drains were then placed to the depths of the wound. Bleeders were electrocauterized with bipolar as much as possible.  The skin was then closed with interrupted 4-0 nylon sutures.  A sterile compressive dressing was applied.  On deflation of the tourniquet, all fingers immediately pinked.  Dr. Jacklynn BueMassagee then approached for a supraclavicular block which was performed at the end of the procedure. The patient tolerated the procedure well, was taken to the recovery room for observation in satisfactory condition.  He will be discharged to home [facility] [place] [facility] [place], on [place], 5/325.    ______________________________ Cindee SaltGary Brodi Nery, M.D.   ______________________________ Cindee SaltGary Aianna Fahs, M.D.    GK/MEDQ  D:  04/16/2017  T:  04/16/2017  Job:  [id number]
